# Patient Record
Sex: Male | Born: 1990 | Race: White | Hispanic: Yes | Marital: Married | Smoking: Never smoker
Health system: Southern US, Community
[De-identification: ages and names within clinical notes are randomized; demographics above are authoritative.]

## PROBLEM LIST (undated history)

## (undated) DIAGNOSIS — R7303 Prediabetes: Secondary | ICD-10-CM

## (undated) HISTORY — DX: Prediabetes: R73.03

---

## 2020-12-26 ENCOUNTER — Emergency Department (HOSPITAL_COMMUNITY): Payer: Self-pay

## 2020-12-26 ENCOUNTER — Emergency Department (HOSPITAL_COMMUNITY)
Admission: EM | Admit: 2020-12-26 | Discharge: 2020-12-26 | Disposition: A | Discharge status: Home / Self Care | Payer: Self-pay | Attending: Student | Admitting: Student

## 2020-12-26 DIAGNOSIS — R531 Weakness: Secondary | ICD-10-CM | POA: Insufficient documentation

## 2020-12-26 DIAGNOSIS — B349 Viral infection, unspecified: Secondary | ICD-10-CM | POA: Insufficient documentation

## 2020-12-26 DIAGNOSIS — R42 Dizziness and giddiness: Secondary | ICD-10-CM | POA: Insufficient documentation

## 2020-12-26 DIAGNOSIS — R0602 Shortness of breath: Secondary | ICD-10-CM | POA: Insufficient documentation

## 2020-12-26 DIAGNOSIS — Z20822 Contact with and (suspected) exposure to covid-19: Secondary | ICD-10-CM | POA: Insufficient documentation

## 2020-12-26 LAB — CBC WITH DIFFERENTIAL/PLATELET
Abs Immature Granulocytes: 0.04 10*3/uL (ref 0.00–0.07)
Basophils Absolute: 0 10*3/uL (ref 0.0–0.1)
Basophils Relative: 0 %
Eosinophils Absolute: 0.1 10*3/uL (ref 0.0–0.5)
Eosinophils Relative: 1 %
HCT: 47.7 % (ref 39.0–52.0)
Hemoglobin: 16.3 g/dL (ref 13.0–17.0)
Immature Granulocytes: 0 %
Lymphocytes Relative: 6 %
Lymphs Abs: 0.7 10*3/uL (ref 0.7–4.0)
MCH: 29.2 pg (ref 26.0–34.0)
MCHC: 34.2 g/dL (ref 30.0–36.0)
MCV: 85.3 fL (ref 80.0–100.0)
Monocytes Absolute: 0.4 10*3/uL (ref 0.1–1.0)
Monocytes Relative: 3 %
Neutro Abs: 10.6 10*3/uL — ABNORMAL HIGH (ref 1.7–7.7)
Neutrophils Relative %: 90 %
Platelets: 204 10*3/uL (ref 150–400)
RBC: 5.59 MIL/uL (ref 4.22–5.81)
RDW: 13.1 % (ref 11.5–15.5)
WBC: 11.8 10*3/uL — ABNORMAL HIGH (ref 4.0–10.5)
nRBC: 0 % (ref 0.0–0.2)

## 2020-12-26 LAB — URINALYSIS, ROUTINE W REFLEX MICROSCOPIC
Bacteria, UA: NONE SEEN
Bilirubin Urine: NEGATIVE
Glucose, UA: NEGATIVE mg/dL
Ketones, ur: NEGATIVE mg/dL
Leukocytes,Ua: NEGATIVE
Nitrite: NEGATIVE
Protein, ur: NEGATIVE mg/dL
Specific Gravity, Urine: 1.006 (ref 1.005–1.030)
pH: 5 (ref 5.0–8.0)

## 2020-12-26 LAB — BASIC METABOLIC PANEL
Anion gap: 13 (ref 5–15)
BUN: 11 mg/dL (ref 6–20)
CO2: 20 mmol/L — ABNORMAL LOW (ref 22–32)
Calcium: 9.8 mg/dL (ref 8.9–10.3)
Chloride: 104 mmol/L (ref 98–111)
Creatinine, Ser: 1.13 mg/dL (ref 0.61–1.24)
GFR, Estimated: >60 mL/min (ref >60)
Glucose, Bld: 103 mg/dL — ABNORMAL HIGH (ref 70–99)
Potassium: 3.5 mmol/L (ref 3.5–5.1)
Sodium: 137 mmol/L (ref 135–145)

## 2020-12-26 LAB — HEPATIC FUNCTION PANEL
ALT: 34 U/L (ref 0–44)
AST: 26 U/L (ref 15–41)
Albumin: 4.6 g/dL (ref 3.5–5.0)
Alkaline Phosphatase: 62 U/L (ref 38–126)
Bilirubin, Direct: 0.2 mg/dL (ref 0.0–0.2)
Indirect Bilirubin: 1.2 mg/dL — ABNORMAL HIGH (ref 0.3–0.9)
Total Bilirubin: 1.4 mg/dL — ABNORMAL HIGH (ref 0.3–1.2)
Total Protein: 8.2 g/dL — ABNORMAL HIGH (ref 6.5–8.1)

## 2020-12-26 LAB — RESP PANEL BY RT-PCR (FLU A&B, COVID) ARPGX2
Influenza A by PCR: NEGATIVE
Influenza B by PCR: NEGATIVE
SARS Coronavirus 2 by RT PCR: NEGATIVE

## 2020-12-26 LAB — TROPONIN I (HIGH SENSITIVITY): Troponin I (High Sensitivity): 3 ng/L (ref <18)

## 2020-12-26 MED ORDER — SODIUM CHLORIDE 0.9 % IV BOLUS
1000.0000 mL | Freq: Once | INTRAVENOUS | Status: AC
  Administered 2020-12-26: 20:00:00 1000 mL via INTRAVENOUS

## 2020-12-26 MED ORDER — KETOROLAC TROMETHAMINE 15 MG/ML IJ SOLN: 15.0000 mg | Freq: Once | INTRAMUSCULAR | Status: DC

## 2020-12-26 MED ORDER — ACETAMINOPHEN 325 MG PO TABS
650.0000 mg | ORAL_TABLET | Freq: Once | ORAL | Status: AC
  Administered 2020-12-26: 20:00:00 650 mg via ORAL
  Filled 2020-12-26: qty 2

## 2020-12-26 NOTE — ED Triage Notes (Signed)
Pt c/o dizziness, lower back pain, intermittent numbness to R hand, diarrhea & nausea/emesis since last night. Unsure if r/t eating pistachios Denies SHOB, CP, fevers, urinary sx, denies injury to area.

## 2020-12-26 NOTE — ED Notes (Signed)
Patient transported to CT 

## 2020-12-26 NOTE — ED Notes (Signed)
RN reviewed discharge instructions with pt. Pt verbalized understanding and had no further questions. VSS upon discharge.  

## 2020-12-26 NOTE — ED Provider Notes (Signed)
[facility] Provider Note   CSN: [id number] Arrival date & time: 12/26/20  1632     History Chief Complaint  Patient presents with   Dizziness   Back Pain    Logan Cox is a 30 y.o. male.  30 y.o male with a PMH of borderline diabetes presents to the ED with couple complaints including chills, low back pain, nausea, vomiting, diarrhea, right hand numbness that began last night of sudden onset.  Patient reports all his symptoms began after eating pistachios last night, is unsure whether these were "infected ".  He does endorse pain throughout, feeling short of breath, this occurs while at rest.  He has not taken any medication for improvement in symptoms.  According to his wife at the bedside who is providing collateral information, patient has only been drinking chai latte's.  He states he feels like "I have the flu but I have such a bad back pain ".  He did take 2 rapid COVID test at home which were both negative.  He also endorses decrease in appetite, has not had any food intake today.  He does have prior episodes of chest pain that had been occurring last month, states these are a sudden sharpness to the left side of the chest, his father did have prior bypass at the age of 22.  He does not have any history of CAD, non-smoker.  No vomiting, no the patient, no headaches.   The history is provided by the patient and medical records.  Dizziness Quality:  Head spinning, room spinning and lightheadedness Severity:  Moderate Onset quality:  Sudden Duration:  1 day Timing:  Constant Progression:  Worsening Chronicity:  New Associated symptoms: chest pain, diarrhea, nausea, shortness of breath and weakness   Associated symptoms: no vomiting   Risk factors: no heart disease, no hx of stroke, no hx of vertigo and no new medications   Back Pain Associated symptoms: abdominal pain, chest pain, fever and weakness       No past medical  history on file.  There are no problems to display for this patient.     Home Medications Prior to Admission medications   Not on File    Allergies    Patient has no allergy information on record.  Review of Systems   Review of Systems  Constitutional:  Positive for chills and fever.  HENT:  Negative for sinus pressure and sore throat.   Respiratory:  Positive for shortness of breath.   Cardiovascular:  Positive for chest pain.  Gastrointestinal:  Positive for abdominal pain, diarrhea and nausea. Negative for vomiting.  Genitourinary:  Negative for difficulty urinating and frequency.  Musculoskeletal:  Positive for back pain.  Neurological:  Positive for dizziness, weakness and light-headedness.  All other systems reviewed and are negative.  Physical Exam Updated Vital Signs BP 125/70    Pulse 81    Temp 100.1 F (37.8 C) (Oral)    Resp 18    SpO2 100%   Physical Exam Vitals and nursing note reviewed.  Constitutional:      Appearance: Normal appearance. He is ill-appearing.  HENT:     Head: Normocephalic and atraumatic.     Nose: Nose normal.     Mouth/Throat:     Mouth: Mucous membranes are moist.  Eyes:     Pupils: Pupils are equal, round, and reactive to light.  Cardiovascular:     Rate and Rhythm: Normal rate.  Pulses:          Dorsalis pedis pulses are 2+ on the right side and 2+ on the left side.       Posterior tibial pulses are 2+ on the right side and 2+ on the left side.  Pulmonary:     Effort: Pulmonary effort is normal.     Breath sounds: No rales.  Abdominal:     General: Abdomen is flat.     Palpations: Abdomen is soft.     Tenderness: There is no abdominal tenderness. There is right CVA tenderness. There is no left CVA tenderness.  Musculoskeletal:        General: No swelling.     Cervical back: Normal range of motion and neck supple.  Skin:    General: Skin is warm and dry.  Neurological:     Mental Status: He is alert and oriented to  person, place, and time.    ED Results / Procedures / Treatments   Labs (all labs ordered are listed, but only abnormal results are displayed) Labs Reviewed  CBC WITH DIFFERENTIAL/PLATELET - Abnormal; Notable for the following components:      Result Value   WBC 11.8 (*)    Neutro Abs 10.6 (*)    All other components within normal limits  BASIC METABOLIC PANEL - Abnormal; Notable for the following components:   CO2 20 (*)    Glucose, Bld 103 (*)    All other components within normal limits  URINALYSIS, ROUTINE W REFLEX MICROSCOPIC - Abnormal; Notable for the following components:   Color, Urine STRAW (*)    Hgb urine dipstick MODERATE (*)    All other components within normal limits  HEPATIC FUNCTION PANEL - Abnormal; Notable for the following components:   Total Protein 8.2 (*)    Total Bilirubin 1.4 (*)    Indirect Bilirubin 1.2 (*)    All other components within normal limits  RESP PANEL BY RT-PCR (FLU A&B, COVID) [id number]  TROPONIN I (HIGH SENSITIVITY)  TROPONIN I (HIGH SENSITIVITY)    EKG EKG Interpretation  Date/Time:  Tuesday December 26 2020 19:34:03 EST Ventricular Rate:  76 PR Interval:  125 QRS Duration: 98 QT Interval:  359 QTC Calculation: 404 R Axis:   55 Text Interpretation: Sinus rhythm No old tracing to compare Confirmed by Isla Pence [phone]) on 12/26/2020 7:41:20 PM  Radiology DG Chest 2 View  Result Date: 12/26/2020 CLINICAL DATA:  Low back pain EXAM: CHEST - 2 VIEW COMPARISON:  None. FINDINGS: Normal mediastinum and cardiac silhouette. Normal pulmonary vasculature. No evidence of effusion, infiltrate, or pneumothorax. No acute bony abnormality. IMPRESSION: No acute cardiopulmonary process. Electronically Signed   By: Suzy Bouchard M.D.   On: 12/26/2020 20:24   CT Renal Stone Study  Result Date: 12/26/2020 CLINICAL DATA:  Flank pain, kidney stone suspected. EXAM: CT ABDOMEN AND PELVIS WITHOUT CONTRAST TECHNIQUE: Multidetector CT imaging of  the abdomen and pelvis was performed following the standard protocol without IV contrast. COMPARISON:  None. FINDINGS: Lower chest: No acute abnormality. Hepatobiliary: No focal liver abnormality is seen. No gallstones, gallbladder wall thickening, or biliary dilatation. Pancreas: Unremarkable. No pancreatic ductal dilatation or surrounding inflammatory changes. Spleen: Normal in size without focal abnormality. Adrenals/Urinary Tract: No adrenal nodule or mass. No renal or ureteral calculus or obstructive uropathy bilaterally. The bladder is unremarkable. Stomach/Bowel: Stomach is within normal limits. Appendix appears normal. No evidence of bowel wall thickening, distention, or inflammatory changes. Few scattered diverticula are present along the  colon without evidence of diverticulitis. Vascular/Lymphatic: No significant vascular findings are present. No enlarged abdominal or pelvic lymph nodes. Reproductive: Prostate is unremarkable. Other: No free fluid in the pelvis. A small fat containing umbilical hernia is noted. Musculoskeletal: No acute osseous abnormality. IMPRESSION: No evidence of renal or ureteral calculus or obstructive uropathy bilaterally. Electronically Signed   By: Brett Fairy M.D.   On: 12/26/2020 20:53    Procedures Procedures   Medications Ordered in ED Medications  ketorolac (TORADOL) 15 MG/ML injection 15 mg (15 mg Intravenous Not Given 12/26/20 1957)  sodium chloride 0.9 % bolus 1,000 mL (1,000 mLs Intravenous New Bag/Given 12/26/20 1950)  acetaminophen (TYLENOL) tablet 650 mg (650 mg Oral Given 12/26/20 1947)    ED Course  I have reviewed the triage vital signs and the nursing notes.  Pertinent labs & imaging results that were available during my care of the patient were reviewed by me and considered in my medical decision making (see chart for details).    MDM Rules/Calculators/A&P   Patient arrives in the ED in the company of his wife with double chief complaints,  including back pain, shortness of breath, "feeling like I have the flu "which began suddenly last night.  He has not taken any medication, according to wife he is only had some Tylenol today's.  He arrived in the ED febrile with a temperature of 100.1, did not take any medication at home for this.  My primary evaluation patient feels that he is "like I have the flu ", his vitals are remarkable for a low-grade temp.  His lungs are clear to auscultation without any wheezing, rhonchi, rales.  There is some right-sided CVA tenderness but is very minimal.  His abdomen is soft, nontender to palpation.  No guarding, no rebound.  Moves all upper and lower extremities.  Oropharynx is clear without any tonsillar exudates or PTA noted.  No sinus pressure along the maxillary or frontal sinuses.  Labs on today's visit reveal a CBC with a slight leukocytosis of 11.8, hemoglobin is within normal limits.  BMP with no electrolyte derangement, slight elevation of his glucose but does report he was told he was likely borderline diabetic.  LFTs are within normal limits.  First troponin is 3.  Rapid influenza, COVID-19 test are negative on today's visit.  X-ray is without any pneumonia, no other acute findings.  Urine that showed moderate hemoglobin, he reports no urinary symptoms such as dysuria, or hematuria.  However, CT renal stone was obtained and ordered to rule out infected stone.  CT renal stone: No evidence of renal or ureteral calculus or obstructive uropathy  bilaterally.      These results were discussed at length with patient, along with significant other at the bedside.  We discussed likely viral etiology.  No signs of infection on work-up.  Negative CT scan.  Patient does report having pistachios prior to symptoms beginning, some suspicion for food poisoning at this time however we discussed we are unable to pinpoint this at this time.  Patient recently also had some life changes.  He is reporting improvement  in his symptoms after he received medications along with fluids.  We discussed follow-up with PCP closely, return to the ED if his symptoms worsen.  Patient understands and agrees to management, return precautions discussed at length.   Portions of this note were generated with Lobbyist. Dictation errors may occur despite best attempts at proofreading.  Final Clinical Impression(s) / ED Diagnoses Final  diagnoses:  Viral illness    Rx / DC Orders ED Discharge Orders     None        Freddy Jaksch 12/26/20 2157    Jacalyn Lefevre, MD 12/26/20 2228

## 2020-12-26 NOTE — ED Provider Notes (Signed)
Emergency Medicine Provider Triage Evaluation Note  Logan Cox , a 30 y.o. male  was evaluated in triage.  Pt complains of multiple symptoms including chills, low back pain, nausea and vomiting, diarrhea, right hand numbness starting last night.  No cough, sore throat, shortness of breath.  Back pain is bilateral and lower.  He states that he ate some pistachios that tasted funny and he was concerned that he is having a reaction.  No dysuria, increased frequency urgency, hematuria.  Patient has been having more chronic upper abdominal pain and nausea after eating and drinking recently.  States that he is "prediabetic".  Review of Systems  Positive: Nausea and vomiting, chills Negative: Chest pain shortness of breath  Physical Exam  BP 124/78 (BP Location: Left Arm)    Pulse 87    Temp 100.1 F (37.8 C) (Oral)    Resp 14    SpO2 99%  Gen:   Awake, no distress   Resp:  Normal effort  MSK:   Moves extremities without difficulty  Other:  ENT exam negative, no abdominal pain  Medical Decision Making  Medically screening exam initiated at 4:43 PM.  Appropriate orders placed.  Genevieve Jerran Tappan was informed that the remainder of the evaluation will be completed by another provider, this initial triage assessment does not replace that evaluation, and the importance of remaining in the ED until their evaluation is complete.     Renne Crigler, PA-C 12/26/20 1644    Glendora Score, MD 12/26/20 2337

## 2020-12-26 NOTE — ED Notes (Signed)
Patient transported to X-ray 

## 2020-12-26 NOTE — Discharge Instructions (Addendum)
Your laboratory results are within normal limits on today's visit.  I discussed the results of your CT imaging, you were provided with a copy of this for your records.  You tested negative for COVID-19, influenza A, influenza B.

## 2021-02-05 ENCOUNTER — Other Ambulatory Visit: Payer: Self-pay

## 2021-02-05 ENCOUNTER — Ambulatory Visit: Payer: BC Managed Care – PPO | Admitting: Cardiology

## 2021-02-05 ENCOUNTER — Encounter: Payer: Self-pay | Admitting: Cardiology

## 2021-02-05 VITALS — BP 113/85 | HR 72 | Temp 97.9°F | Resp 16 | Ht 71.0 in | Wt 204.0 lb

## 2021-02-05 DIAGNOSIS — R7303 Prediabetes: Secondary | ICD-10-CM

## 2021-02-05 DIAGNOSIS — R072 Precordial pain: Secondary | ICD-10-CM

## 2021-02-05 DIAGNOSIS — Z8249 Family history of ischemic heart disease and other diseases of the circulatory system: Secondary | ICD-10-CM

## 2021-02-05 NOTE — Progress Notes (Signed)
Date:  02/05/2021   ID:  Logan Cox, DOB [birth year], MRN [id number]  PCP:  Shanon Rosser, PA-C  Cardiologist:  Rex Kras, DO, [facility] (established care 02/05/2021)  REASON FOR CONSULT: Chest pain  REQUESTING PHYSICIAN:  Shanon Rosser, PA-C [address] [place],  [place] [phone]  Chief Complaint  Patient presents with   Chest Pain   New Patient (Initial Visit)    HPI  Logan Cox is a 31 y.o. Spanish male who presents to the office with a chief complaint of "chest pain." Patient's past medical history and cardiovascular risk factors include: Prediabetes, family history of heart disease.  He is referred to the office at the request of Long, Nicki Reaper, PA-C for evaluation of chest pain.  Patient presents today for evaluation of chest pain.  Symptoms started approximately 1 month ago, located over the left anterior chest wall, at times radiating to the left neck, lasting for a few minutes, self-limited.  The discomfort is predominantly intermittent, not brought on by effort related activities, not resolving with rest.  Patient stated the symptoms would improve with standing and leaning forward and worse with resting.  Patient did not have worsening of symptoms with lying supine.  Last episode of chest pain was 3 weeks ago.  No history of rheumatic fever, tuberculosis, no recent sick contacts, patient had a GI upset for approximately 30 hours 6 weeks ago.  No change in overall physical endurance, does not experience exhaustion or difficulty in breathing.  Denies heart failure symptoms.  Patient states that his father did have heart disease in his 32s which eventually resulted into bypass surgery.  He does not know any additional details.  FUNCTIONAL STATUS: No structured exercise program or daily routine.   ALLERGIES: Allergies  Allergen Reactions   Shellfish Allergy     MEDICATION LIST PRIOR TO VISIT: No outpatient medications have been marked as taking  for the 02/05/21 encounter (Office Visit) with Rex Kras, DO.     PAST MEDICAL HISTORY: Past Medical History:  Diagnosis Date   Pre-diabetes     PAST SURGICAL HISTORY: History reviewed. No pertinent surgical history.  FAMILY HISTORY: Father has history of heart disease in his 74s (patient is unaware of additional details).  SOCIAL HISTORY:  The patient  reports that he has never smoked. He has never used smokeless tobacco. He reports that he does not drink alcohol and does not use drugs.  REVIEW OF SYSTEMS: Review of Systems  Constitutional: Negative for chills and fever.  HENT:  Negative for hoarse voice and nosebleeds.   Eyes:  Negative for discharge, double vision and pain.  Cardiovascular:  Positive for chest pain (last episode 3 weeks ago). Negative for claudication, dyspnea on exertion, leg swelling, near-syncope, orthopnea, palpitations, paroxysmal nocturnal dyspnea and syncope.  Respiratory:  Negative for hemoptysis and shortness of breath.   Musculoskeletal:  Negative for muscle cramps and myalgias.  Gastrointestinal:  Negative for abdominal pain, constipation, diarrhea, hematemesis, hematochezia, melena, nausea and vomiting.  Neurological:  Negative for dizziness and light-headedness.   PHYSICAL EXAM: Vitals with BMI 02/05/2021 12/26/2020 12/26/2020  Height 5' 11"  - -  Weight 204 lbs - -  BMI 32.12 - -  Systolic 248 250 037  Diastolic 85 67 70  Pulse 72 74 81    CONSTITUTIONAL: Well-developed and well-nourished. No acute distress.  SKIN: Skin is warm and dry. No rash noted. No cyanosis. No pallor. No jaundice HEAD: Normocephalic and atraumatic.  EYES: No scleral icterus MOUTH/THROAT:  Moist oral membranes.  NECK: No JVD present. No thyromegaly noted. No carotid bruits  LYMPHATIC: No visible cervical adenopathy.  CHEST Normal respiratory effort. No intercostal retractions  LUNGS: Clear to auscultation bilaterally.  No stridor. No wheezes. No rales.   CARDIOVASCULAR: Regular rate and rhythm, positive S1-S2, no murmurs rubs or gallops appreciated. ABDOMINAL: Soft, nontender, nondistended, positive bowel sounds all 4 quadrants, no apparent ascites.  EXTREMITIES: No peripheral edema, warm to touch, 2+ bilateral DP and PT pulses HEMATOLOGIC: No significant bruising NEUROLOGIC: Oriented to person, place, and time. Nonfocal. Normal muscle tone.  PSYCHIATRIC: Normal mood and affect. Normal behavior. Cooperative  CARDIAC DATABASE: EKG: 02/05/2021: NSR, 65 bpm, normal axis, without underlying ischemia or injury pattern.  Echocardiogram: No results found for this or any previous visit from the past 1095 days.    Stress Testing: No results found for this or any previous visit from the past 1095 days.  Heart Catheterization: None  LABORATORY DATA: CBC Latest Ref Rng & Units 12/26/2020  WBC 4.0 - 10.5 K/uL 11.8(H)  Hemoglobin 13.0 - 17.0 g/dL 16.3  Hematocrit 39.0 - 52.0 % 47.7  Platelets 150 - 400 K/uL 204    CMP Latest Ref Rng & Units 12/26/2020  Glucose 70 - 99 mg/dL 103(H)  BUN 6 - 20 mg/dL 11  Creatinine 0.61 - 1.24 mg/dL 1.13  Sodium 135 - 145 mmol/L 137  Potassium 3.5 - 5.1 mmol/L 3.5  Chloride 98 - 111 mmol/L 104  CO2 22 - 32 mmol/L 20(L)  Calcium 8.9 - 10.3 mg/dL 9.8  Total Protein 6.5 - 8.1 g/dL 8.2(H)  Total Bilirubin 0.3 - 1.2 mg/dL 1.4(H)  Alkaline Phos 38 - 126 U/L 62  AST 15 - 41 U/L 26  ALT 0 - 44 U/L 34    Lipid Panel  No results found for: CHOL, TRIG, HDL, CHOLHDL, VLDL, LDLCALC, LDLDIRECT, LABVLDL  No components found for: NTPROBNP No results for input(s): PROBNP in the last 8760 hours. No results for input(s): TSH in the last 8760 hours.  BMP Recent Labs    12/26/20 1650  NA 137  K 3.5  CL 104  CO2 20*  GLUCOSE 103*  BUN 11  CREATININE 1.13  CALCIUM 9.8  GFRNONAA >60    HEMOGLOBIN A1C No results found for: HGBA1C, MPG  External Labs: Collected: 11/20/2020 available in Care  Everywhere. BUN 15, creatinine 1.1. eGFR 93. Sodium 140, potassium 4, chloride 104, bicarb 25, AST 24, ALT 39, alkaline phosphatase 66 Hemoglobin 16 g/dL, hematocrit 46.8% A1c 5.8  IMPRESSION:    ICD-10-CM   1. Precordial pain  R07.2 EKG 12-Lead    2. Prediabetes  R73.03     3. Family history of heart disease  Z82.49        RECOMMENDATIONS: Laureano Jassiel Flye is a 31 y.o. Spanish descent male whose past medical history and cardiac risk factors include: Prediabetes, family history of heart disease.  Patient presents to the office for evaluation of precordial discomfort.  He has remained asymptomatic for the last 3 weeks and based on his symptoms his precordial discomfort not suggestive of anginal discomfort but likely due to pericarditis.  EKG today shows normal sinus rhythm without underlying ischemia or injury pattern.  Similar EKG noted at his PCPs office.  Since the patient is asymptomatic recommend primary prevention of CAD given his family history of heart disease.  His pretest probability of obstructive CAD at the age of 3 given his symptoms is low; however, given his family history  I have strongly encouraged him to consume a heart healthy diet, have his cholesterol checked by his PCP, increasing physical activity as tolerated to goal of 30 minutes a day 5 days a week.  If he has recurrence of symptoms (either cardiac or non-cardiac) will be very appropriate to proceed with surface echocardiogram and a exercise treadmill stress test.  Patient is agreeable with the plan of care.  For now we will monitor him clinically.   As part of this initial consultation reviewed outside records provided by PCP which included office note, EKG, labs these findings have been summarized and noted above for further reference.  Discussed disease management, ordering diagnostic testing, coordination of care and patient education provided as a part of today's encounter.  Thank you for allowing  [place] to participate in the care of [place] please reach out if any questions or concerns arise.  FINAL MEDICATION LIST END OF ENCOUNTER: No orders of the defined types were placed in this encounter.   There are no discontinued medications.  No current outpatient medications on file.  Orders Placed This Encounter  Procedures   EKG 12-Lead    There are no Patient Instructions on file for this visit.   --Continue cardiac medications as reconciled in final medication list. --Return if symptoms worsen or fail to improve. Or sooner if needed. --Continue follow-up with your primary care physician regarding the management of your other chronic comorbid conditions.  Patient's questions and concerns were addressed to his satisfaction. He voices understanding of the instructions provided during this encounter.   This note was created using a voice recognition software as a result there may be grammatical errors inadvertently enclosed that do not reflect the nature of this encounter. Every attempt is made to correct such errors.  Rex Kras, [place], [facility]  Pager: [phone] Office: [phone]

## 2021-03-02 ENCOUNTER — Other Ambulatory Visit: Payer: Self-pay | Admitting: Gastroenterology

## 2021-03-02 ENCOUNTER — Other Ambulatory Visit (HOSPITAL_COMMUNITY): Payer: Self-pay | Admitting: Gastroenterology

## 2021-03-02 DIAGNOSIS — R1011 Right upper quadrant pain: Secondary | ICD-10-CM

## 2021-03-16 ENCOUNTER — Ambulatory Visit (HOSPITAL_COMMUNITY): Payer: Self-pay

## 2021-03-20 ENCOUNTER — Ambulatory Visit (HOSPITAL_COMMUNITY)
Admission: RE | Admit: 2021-03-20 | Discharge: 2021-03-20 | Disposition: A | Discharge status: Home / Self Care | Payer: BC Managed Care – PPO | Source: Ambulatory Visit | Attending: Gastroenterology | Admitting: Gastroenterology

## 2021-03-20 ENCOUNTER — Other Ambulatory Visit: Payer: Self-pay

## 2021-03-20 DIAGNOSIS — R1011 Right upper quadrant pain: Secondary | ICD-10-CM | POA: Insufficient documentation

## 2021-04-05 ENCOUNTER — Ambulatory Visit (HOSPITAL_COMMUNITY): Payer: BC Managed Care – PPO

## 2021-04-11 ENCOUNTER — Ambulatory Visit (HOSPITAL_COMMUNITY)
Admission: RE | Admit: 2021-04-11 | Discharge: 2021-04-11 | Disposition: A | Discharge status: Home / Self Care | Payer: BC Managed Care – PPO | Source: Ambulatory Visit | Attending: Gastroenterology | Admitting: Gastroenterology

## 2021-04-11 DIAGNOSIS — R1011 Right upper quadrant pain: Secondary | ICD-10-CM | POA: Insufficient documentation

## 2021-04-11 MED ORDER — TECHNETIUM TC 99M MEBROFENIN IV KIT: 5.0000 | PACK | Freq: Once | INTRAVENOUS | Status: DC | PRN

## 2021-04-11 MED ORDER — TECHNETIUM TC 99M MEBROFENIN IV KIT
5.0000 | PACK | Freq: Once | INTRAVENOUS | Status: AC | PRN
  Administered 2021-04-11: 5 via INTRAVENOUS

## 2021-08-02 ENCOUNTER — Other Ambulatory Visit: Payer: Self-pay

## 2021-08-02 ENCOUNTER — Emergency Department (HOSPITAL_COMMUNITY)
Admission: EM | Admit: 2021-08-02 | Discharge: 2021-08-02 | Discharge status: Against Medical Advice | Payer: BC Managed Care – PPO | Attending: Emergency Medicine | Admitting: Emergency Medicine

## 2021-08-02 DIAGNOSIS — Z5321 Procedure and treatment not carried out due to patient leaving prior to being seen by health care provider: Secondary | ICD-10-CM | POA: Diagnosis not present

## 2021-08-02 DIAGNOSIS — R079 Chest pain, unspecified: Secondary | ICD-10-CM | POA: Diagnosis not present

## 2021-08-02 DIAGNOSIS — M79602 Pain in left arm: Secondary | ICD-10-CM | POA: Diagnosis not present

## 2021-08-02 NOTE — ED Notes (Signed)
Patient states the wait is too lon and is leaving

## 2021-08-02 NOTE — ED Provider Triage Note (Signed)
Emergency Medicine Provider Triage Evaluation Note  Logan Cox , a 31 y.o. male  was evaluated in triage.  Pt complains of left arm pain originating at his neck and radiating down his left arm.  Reports the pain radiates into his chest however his chest does not hurt?.  No alleviating or exacerbating factors although he does get some relief after sleeping on it.  He is taking some ibuprofen which helps with the pain.  No prior history of CAD for him, family history remarkable for father having an MI in his 34s.  He is prediabetic but not hypertensive.  Review of Systems  Positive: Chest pain, left arm pain Negative: Fever, cough.  Physical Exam  There were no vitals taken for this visit. Gen:   Awake, no distress  . Resp:  Normal effort  MSK:   Moves extremities without difficulty  Other:  Moves lateral upper extremities without any weakness.  Medical Decision Making  Medically screening exam initiated at 5:30 PM.  Appropriate orders placed.  Chandon Erskine Steinfeldt was informed that the remainder of the evaluation will be completed by another provider, this initial triage assessment does not replace that evaluation, and the importance of remaining in the ED until their evaluation is complete.     Claude Manges, PA-C 08/02/21 1736

## 2021-08-02 NOTE — ED Triage Notes (Signed)
Pt c/o pressure/tingling L arm pain x3-4 days, worsening & increasing in frequency over last 2 days. Advises that pain "sometimes goes into chest & up neck." Denies personal cardiac hx, advises he is prediabetic. Denies meds PTA

## 2021-08-07 ENCOUNTER — Ambulatory Visit: Payer: BC Managed Care – PPO | Admitting: Cardiology

## 2021-08-21 ENCOUNTER — Ambulatory Visit: Payer: BC Managed Care – PPO | Admitting: Cardiology

## 2022-03-18 ENCOUNTER — Other Ambulatory Visit: Payer: Self-pay

## 2022-03-18 ENCOUNTER — Emergency Department (HOSPITAL_BASED_OUTPATIENT_CLINIC_OR_DEPARTMENT_OTHER)
Admission: EM | Admit: 2022-03-18 | Discharge: 2022-03-18 | Disposition: A | Discharge status: Home / Self Care | Payer: BC Managed Care – PPO | Attending: Emergency Medicine | Admitting: Emergency Medicine

## 2022-03-18 ENCOUNTER — Emergency Department (HOSPITAL_BASED_OUTPATIENT_CLINIC_OR_DEPARTMENT_OTHER): Payer: BC Managed Care – PPO

## 2022-03-18 ENCOUNTER — Encounter (HOSPITAL_BASED_OUTPATIENT_CLINIC_OR_DEPARTMENT_OTHER): Payer: Self-pay | Admitting: Emergency Medicine

## 2022-03-18 DIAGNOSIS — R197 Diarrhea, unspecified: Secondary | ICD-10-CM

## 2022-03-18 DIAGNOSIS — Z1152 Encounter for screening for COVID-19: Secondary | ICD-10-CM | POA: Diagnosis not present

## 2022-03-18 LAB — RESP PANEL BY RT-PCR (RSV, FLU A&B, COVID)  RVPGX2
Influenza A by PCR: NEGATIVE
Influenza B by PCR: NEGATIVE
Resp Syncytial Virus by PCR: NEGATIVE
SARS Coronavirus 2 by RT PCR: NEGATIVE

## 2022-03-18 LAB — COMPREHENSIVE METABOLIC PANEL
ALT: 94 U/L — ABNORMAL HIGH (ref 0–44)
AST: 77 U/L — ABNORMAL HIGH (ref 15–41)
Albumin: 4.9 g/dL (ref 3.5–5.0)
Alkaline Phosphatase: 60 U/L (ref 38–126)
Anion gap: 17 — ABNORMAL HIGH (ref 5–15)
BUN: 17 mg/dL (ref 6–20)
CO2: 19 mmol/L — ABNORMAL LOW (ref 22–32)
Calcium: 10.5 mg/dL — ABNORMAL HIGH (ref 8.9–10.3)
Chloride: 98 mmol/L (ref 98–111)
Creatinine, Ser: 1.23 mg/dL (ref 0.61–1.24)
GFR, Estimated: >60 mL/min (ref >60)
Glucose, Bld: 103 mg/dL — ABNORMAL HIGH (ref 70–99)
Potassium: 3.4 mmol/L — ABNORMAL LOW (ref 3.5–5.1)
Sodium: 134 mmol/L — ABNORMAL LOW (ref 135–145)
Total Bilirubin: 0.5 mg/dL (ref 0.3–1.2)
Total Protein: 9.4 g/dL — ABNORMAL HIGH (ref 6.5–8.1)

## 2022-03-18 LAB — URINALYSIS, ROUTINE W REFLEX MICROSCOPIC
Bacteria, UA: NONE SEEN
Bilirubin Urine: NEGATIVE
Glucose, UA: NEGATIVE mg/dL
Ketones, ur: NEGATIVE mg/dL
Leukocytes,Ua: NEGATIVE
Nitrite: NEGATIVE
Protein, ur: 30 mg/dL — AB
Specific Gravity, Urine: >1.046 — ABNORMAL HIGH (ref 1.005–1.030)
pH: 6 (ref 5.0–8.0)

## 2022-03-18 LAB — CBC
HCT: 48.5 % (ref 39.0–52.0)
Hemoglobin: 16.8 g/dL (ref 13.0–17.0)
MCH: 29 pg (ref 26.0–34.0)
MCHC: 34.6 g/dL (ref 30.0–36.0)
MCV: 83.8 fL (ref 80.0–100.0)
Platelets: 222 10*3/uL (ref 150–400)
RBC: 5.79 MIL/uL (ref 4.22–5.81)
RDW: 13.5 % (ref 11.5–15.5)
WBC: 12.4 10*3/uL — ABNORMAL HIGH (ref 4.0–10.5)
nRBC: 0 % (ref 0.0–0.2)

## 2022-03-18 LAB — LIPASE, BLOOD: Lipase: 119 U/L — ABNORMAL HIGH (ref 11–51)

## 2022-03-18 MED ORDER — IOHEXOL 300 MG/ML  SOLN
100.0000 mL | Freq: Once | INTRAMUSCULAR | Status: AC | PRN
  Administered 2022-03-18: 85 mL via INTRAVENOUS

## 2022-03-18 MED ORDER — ACETAMINOPHEN 325 MG PO TABS
650.0000 mg | ORAL_TABLET | Freq: Once | ORAL | Status: AC
  Administered 2022-03-18: 650 mg via ORAL
  Filled 2022-03-18: qty 2

## 2022-03-18 MED ORDER — SODIUM CHLORIDE 0.9 % IV BOLUS
1000.0000 mL | Freq: Once | INTRAVENOUS | Status: AC
  Administered 2022-03-18: 1000 mL via INTRAVENOUS

## 2022-03-18 MED ORDER — ONDANSETRON HCL 4 MG/2ML IJ SOLN
4.0000 mg | Freq: Once | INTRAMUSCULAR | Status: AC
  Administered 2022-03-18: 4 mg via INTRAVENOUS
  Filled 2022-03-18: qty 2

## 2022-03-18 MED ORDER — ONDANSETRON 4 MG PO TBDP: ORAL_TABLET | ORAL | 0 refills | Status: AC

## 2022-03-18 MED ORDER — PANTOPRAZOLE SODIUM 40 MG IV SOLR
40.0000 mg | Freq: Once | INTRAVENOUS | Status: AC
  Administered 2022-03-18: 40 mg via INTRAVENOUS
  Filled 2022-03-18: qty 10

## 2022-03-18 NOTE — ED Provider Notes (Signed)
[facility] Provider Note   CSN: [id number] Arrival date & time: 03/18/22  1820     History  Chief Complaint  Patient presents with   Abdominal Pain    Hence Logan Cox is a 32 y.o. male.  Patient is a 27 old male who presents with nausea and diarrhea.  He states that he just got back from a cruise to [place] in [place].  Although he says he did not eat or drink anything other than Cruise food on her [place] and bottled water.  [place] did [place] on the [place] but it was a cruise on [place].  He says hurting about 2 AM this morning he noticed to have some abdominal cramping associated with profuse watery diarrhea and nausea.  He is having multiple episodes of watery diarrhea, no blood.  He is also having a little bit of a cough and subjective fevers.  He has a prior history of gallbladder disease.  He denies any rhinorrhea.  No rashes.  His brother has similar symptoms he was also on the cruise.       Home Medications Prior to Admission medications   Medication Sig Start Date End Date Taking? Authorizing Provider  ondansetron (ZOFRAN-ODT) 4 MG disintegrating tablet '4mg'$  ODT q4 hours prn nausea/vomit 03/18/22  Yes Malvin Johns, MD      Allergies    Shellfish allergy    Review of Systems   Review of Systems  Constitutional:  Positive for fatigue and fever. Negative for chills and diaphoresis.  HENT:  Negative for congestion, rhinorrhea and sneezing.   Eyes: Negative.   Respiratory:  Negative for cough, chest tightness and shortness of breath.   Cardiovascular:  Negative for chest pain and leg swelling.  Gastrointestinal:  Positive for abdominal pain, diarrhea and nausea. Negative for blood in stool and vomiting.  Genitourinary:  Negative for difficulty urinating, flank pain, frequency and hematuria.  Musculoskeletal:  Negative for arthralgias and back pain.  Skin:  Negative for rash.  Neurological:   Negative for dizziness, speech difficulty, weakness, numbness and headaches.    Physical Exam Updated Vital Signs BP (!) 140/73   Pulse 93   Temp 99.2 F (37.3 C) (Oral)   Resp 16   Ht '5\' 11"'$  (1.803 m)   Wt 99.8 kg   SpO2 96%   BMI 30.68 kg/m  Physical Exam Constitutional:      Appearance: He is well-developed.  HENT:     Head: Normocephalic and atraumatic.  Eyes:     Pupils: Pupils are equal, round, and reactive to light.  Cardiovascular:     Rate and Rhythm: Normal rate and regular rhythm.     Heart sounds: Normal heart sounds.  Pulmonary:     Effort: Pulmonary effort is normal. No respiratory distress.     Breath sounds: Normal breath sounds. No wheezing or rales.  Chest:     Chest wall: No tenderness.  Abdominal:     General: Bowel sounds are normal.     Palpations: Abdomen is soft.     Tenderness: There is abdominal tenderness in the right upper quadrant, epigastric area and left upper quadrant. There is no guarding or rebound.  Musculoskeletal:        General: Normal range of motion.     Cervical back: Normal range of motion and neck supple.  Lymphadenopathy:     Cervical: No cervical adenopathy.  Skin:    General: Skin is warm  and dry.     Findings: No rash.  Neurological:     Mental Status: He is alert and oriented to person, place, and time.     ED Results / Procedures / Treatments   Labs (all labs ordered are listed, but only abnormal results are displayed) Labs Reviewed  LIPASE, BLOOD - Abnormal; Notable for the following components:      Result Value   Lipase 119 (*)    All other components within normal limits  COMPREHENSIVE METABOLIC PANEL - Abnormal; Notable for the following components:   Sodium 134 (*)    Potassium 3.4 (*)    CO2 19 (*)    Glucose, Bld 103 (*)    Calcium 10.5 (*)    Total Protein 9.4 (*)    AST 77 (*)    ALT 94 (*)    Anion gap 17 (*)    All other components within normal limits  CBC - Abnormal; Notable for the  following components:   WBC 12.4 (*)    All other components within normal limits  URINALYSIS, ROUTINE W REFLEX MICROSCOPIC - Abnormal; Notable for the following components:   Color, Urine COLORLESS (*)    Specific Gravity, Urine >1.046 (*)    Hgb urine dipstick MODERATE (*)    Protein, ur 30 (*)    All other components within normal limits  RESP PANEL BY RT-PCR (RSV, FLU A&B, COVID)  [id number]  GASTROINTESTINAL PANEL BY PCR, STOOL (REPLACES STOOL CULTURE)    EKG None  Radiology CT Abdomen Pelvis W Contrast  Result Date: 03/18/2022 CLINICAL DATA:  Abdominal pain, diarrhea, nausea, fevers. The pain is centralized above the navel. Recent [place] undersigned [place]. EXAM: CT ABDOMEN AND PELVIS WITH CONTRAST TECHNIQUE: Multidetector CT imaging of the abdomen and pelvis was performed using the standard protocol following bolus administration of intravenous contrast. RADIATION DOSE REDUCTION: This exam was performed according to the departmental dose-optimization program which includes automated exposure control, adjustment of the mA and/or kV according to patient size and/or use of iterative reconstruction technique. CONTRAST:  88m OMNIPAQUE IOHEXOL 300 MG/ML  SOLN COMPARISON:  CT 12/26/2020 FINDINGS: Lower chest: No acute abnormality. Hepatobiliary: Hepatic steatosis. Gallbladder and biliary tree are unremarkable. Pancreas: Unremarkable. Spleen: Unremarkable. Adrenals/Urinary Tract: Normal adrenal glands. No urinary calculi or hydronephrosis. Unremarkable bladder. Stomach/Bowel: Unremarkable stomach. Normal caliber large and small bowel. No bowel wall thickening. Normal appendix. Vascular/Lymphatic: No significant vascular findings are present. No enlarged abdominal or pelvic lymph nodes. Reproductive: Unremarkable. Other: No free intraperitoneal fluid or air. No abdominal wall hernia. Musculoskeletal: No acute osseous abnormality. IMPRESSION: 1. No acute abnormality. 2. Hepatic steatosis. Electronically  Signed   By: TPlacido SouM.D.   On: 03/18/2022 20:29    Procedures Procedures    Medications Ordered in ED Medications  acetaminophen (TYLENOL) tablet 650 mg (has no administration in time range)  sodium chloride 0.9 % bolus 1,000 mL (0 mLs Intravenous Stopped 03/18/22 2131)  ondansetron (ZOFRAN) injection 4 mg (4 mg Intravenous Given 03/18/22 2026)  pantoprazole (PROTONIX) injection 40 mg (40 mg Intravenous Given 03/18/22 2026)  iohexol (OMNIPAQUE) 300 MG/ML solution 100 mL (85 mLs Intravenous Contrast Given 03/18/22 1948)  sodium chloride 0.9 % bolus 1,000 mL (1,000 mLs Intravenous New Bag/Given 03/18/22 2128)    ED Course/ Medical Decision Making/ A&P                             Medical Decision Making  Amount and/or Complexity of Data Reviewed Labs: ordered. Radiology: ordered.  Risk OTC drugs. Prescription drug management.   Patient is a 32 year old who presents with nausea and profuse diarrhea.  He has a family member with similar symptoms and just came back from a cruise to [place].  No bloody diarrhea.  He had labs which are nonconcerning.  His lipase is minimally elevated but he does not have any pain it is more concerning for pancreatitis.  His LFTs are mildly elevated.  His creatinine is normal.  He had a CT scan which did not show any acute abnormality.  No suggestions of pancreatitis.  No suggestions of gallbladder disease.  No colitis.  I suspect his mild elevation of LFTs and lipase are resulting from this inflammatory illness, likely viral in nature.  He feels much better after IV fluids and antiemetics.  He was also given Protonix.  His stool was sent for GI pathogen testing.  No risk factors for C. difficile.  Advised him and symptomatic care.  He was given a prescription for Zofran.  He was advised to return if his symptoms worsen.  Otherwise follow-up with his primary care doctor.  I did advise that he should have his LFTs rechecked by his PCP to make sure that they  turn to normal.  Return precautions were given.  Final Clinical Impression(s) / ED Diagnoses Final diagnoses:  Diarrhea of presumed infectious origin    Rx / DC Orders ED Discharge Orders          Ordered    ondansetron (ZOFRAN-ODT) 4 MG disintegrating tablet        03/18/22 2225              Malvin Johns, MD 03/18/22 2234

## 2022-03-18 NOTE — ED Notes (Signed)
Reviewed AVS/discharge instruction with patient. Time allotted for and all questions answered. Patient is agreeable for d/c and escorted to ed exit by staff.  

## 2022-03-18 NOTE — ED Notes (Signed)
Pt aware of the need for a urine... Pt currently unable to provide.Marland KitchenMarland Kitchen

## 2022-03-18 NOTE — ED Triage Notes (Signed)
Pt arrives to ED with c/o abdominal pain, diarrhea, nausea, fevers that started this morning. The abd pain is centralized above the naval. Diarrhea <20 times today. He notes his brother has similar symptoms. He notes recently on cruise to [place] and [place] and returned yesterday.

## 2022-03-18 NOTE — ED Notes (Signed)
Warm blanket given, pt notified that urine sample is needed

## 2022-03-19 LAB — GASTROINTESTINAL PANEL BY PCR, STOOL (REPLACES STOOL CULTURE)

## 2023-01-17 IMAGING — NM NM HEPATO W/GB/PHARM/[PERSON_NAME]
2 series · 12 of 12 positions shown · non-contrast
Comparison: Abdominal ultrasound March 20, 2021

CLINICAL DATA: Right upper quadrant abdominal pain with nausea.

EXAM:
NUCLEAR MEDICINE HEPATOBILIARY IMAGING WITH GALLBLADDER EF
TECHNIQUE: Sequential images of the abdomen were obtained [DATE] minutes
following intravenous administration of radiopharmaceutical. After
oral ingestion of Ensure, gallbladder ejection fraction was
determined. At 60 min, normal ejection fraction is greater than 33%.
RADIOPHARMACEUTICALS:  5.0 mCi [id number]-KKm  Choletec IV

[Series 1: biliary · 4.14mm/px · 6 of 60 frames shown]
[frame 6/60]
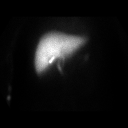
[frame 16/60]
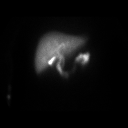
[frame 26/60]
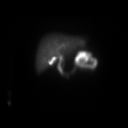
[frame 36/60]
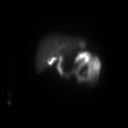
[frame 46/60]
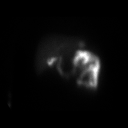
[frame 56/60]
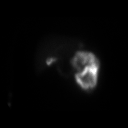

[Series 2: gbef · 4.14mm/px · 6 of 60 frames shown]
[frame 6/60]
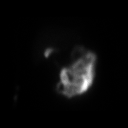
[frame 16/60]
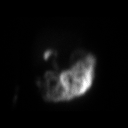
[frame 26/60]
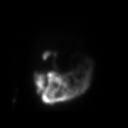
[frame 36/60]
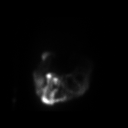
[frame 46/60]
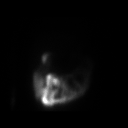
[frame 56/60]
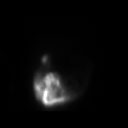

[12 of 12 positions shown; findings below may reference images not displayed]

FINDINGS: Prompt uptake and biliary excretion of activity by the liver is
seen. Gallbladder activity is visualized, consistent with patency of
cystic duct. Biliary activity passes into small bowel, consistent
with patent common bile duct.

Calculated gallbladder ejection fraction is 27%. (Normal gallbladder
ejection fraction with Ensure is greater than 33%.)
IMPRESSION: Reduced gallbladder ejection fraction as can be seen with chronic
cholecystitis/biliary dyskinesia.
